# Patient Record
Sex: Male | Born: 1975 | Hispanic: Yes | Marital: Single | Smoking: Current every day smoker
Health system: Southern US, Community
[De-identification: ages and names within clinical notes are randomized; demographics above are authoritative.]

## PROBLEM LIST (undated history)

## (undated) DIAGNOSIS — B1920 Unspecified viral hepatitis C without hepatic coma: Secondary | ICD-10-CM

## (undated) HISTORY — PX: APPENDECTOMY: SHX54

---

## 2019-03-30 ENCOUNTER — Encounter (HOSPITAL_COMMUNITY): Payer: Self-pay | Admitting: Emergency Medicine

## 2019-03-30 ENCOUNTER — Other Ambulatory Visit: Payer: Self-pay

## 2019-03-30 ENCOUNTER — Emergency Department (HOSPITAL_COMMUNITY)
Admission: EM | Admit: 2019-03-30 | Discharge: 2019-03-31 | Disposition: A | Discharge status: Home / Self Care | Payer: Medicare Other | Attending: Emergency Medicine | Admitting: Emergency Medicine

## 2019-03-30 DIAGNOSIS — R1084 Generalized abdominal pain: Secondary | ICD-10-CM | POA: Insufficient documentation

## 2019-03-30 DIAGNOSIS — F17200 Nicotine dependence, unspecified, uncomplicated: Secondary | ICD-10-CM | POA: Diagnosis not present

## 2019-03-30 DIAGNOSIS — R109 Unspecified abdominal pain: Secondary | ICD-10-CM | POA: Diagnosis present

## 2019-03-30 HISTORY — DX: Unspecified viral hepatitis C without hepatic coma: B19.20

## 2019-03-30 LAB — COMPREHENSIVE METABOLIC PANEL
ALT: 39 U/L (ref 0–44)
AST: 21 U/L (ref 15–41)
Albumin: 3.8 g/dL (ref 3.5–5.0)
Alkaline Phosphatase: 81 U/L (ref 38–126)
Anion gap: 6 (ref 5–15)
BUN: 19 mg/dL (ref 6–20)
CO2: 26 mmol/L (ref 22–32)
Calcium: 8.8 mg/dL — ABNORMAL LOW (ref 8.9–10.3)
Chloride: 105 mmol/L (ref 98–111)
Creatinine, Ser: 1.07 mg/dL (ref 0.61–1.24)
GFR calc Af Amer: >60 mL/min (ref >60)
GFR calc non Af Amer: >60 mL/min (ref >60)
Glucose, Bld: 114 mg/dL — ABNORMAL HIGH (ref 70–99)
Potassium: 3.9 mmol/L (ref 3.5–5.1)
Sodium: 137 mmol/L (ref 135–145)
Total Bilirubin: 0.6 mg/dL (ref 0.3–1.2)
Total Protein: 6.7 g/dL (ref 6.5–8.1)

## 2019-03-30 LAB — URINALYSIS, ROUTINE W REFLEX MICROSCOPIC
Bilirubin Urine: NEGATIVE
Glucose, UA: NEGATIVE mg/dL
Hgb urine dipstick: NEGATIVE
Ketones, ur: NEGATIVE mg/dL
Leukocytes,Ua: NEGATIVE
Nitrite: NEGATIVE
Protein, ur: NEGATIVE mg/dL
Specific Gravity, Urine: 1.008 (ref 1.005–1.030)
pH: 6 (ref 5.0–8.0)

## 2019-03-30 LAB — CBC
HCT: 39.6 % (ref 39.0–52.0)
Hemoglobin: 13 g/dL (ref 13.0–17.0)
MCH: 28.4 pg (ref 26.0–34.0)
MCHC: 32.8 g/dL (ref 30.0–36.0)
MCV: 86.5 fL (ref 80.0–100.0)
Platelets: 211 10*3/uL (ref 150–400)
RBC: 4.58 MIL/uL (ref 4.22–5.81)
RDW: 14.4 % (ref 11.5–15.5)
WBC: 9.4 10*3/uL (ref 4.0–10.5)
nRBC: 0 % (ref 0.0–0.2)

## 2019-03-30 LAB — LIPASE, BLOOD: Lipase: 67 U/L — ABNORMAL HIGH (ref 11–51)

## 2019-03-30 MED ORDER — SODIUM CHLORIDE 0.9% FLUSH
3.0000 mL | Freq: Once | INTRAVENOUS | Status: AC
  Administered 2019-03-31: 3 mL via INTRAVENOUS

## 2019-03-30 NOTE — ED Triage Notes (Signed)
Pt reports abdominal pain with nausea X 3 days. Pt with Hx of hep C.

## 2019-03-30 NOTE — ED Provider Notes (Signed)
[facility] EMERGENCY DEPARTMENT Provider Note   CSN: [id number] Arrival date & time: 03/30/19  2222     History Chief Complaint  Patient presents with  . Abdominal Pain    Micheal Downs is a 44 y.o. male.  Patient presents to the emergency department for evaluation of abdominal pain.  Patient reports pain is predominantly in the left upper abdomen but shoots across to the right lower abdomen.  Pain waxes and wanes.  At times when it is at its worst the entire abdomen hurts.  He describes the pain as a sharp cramp.  Symptoms began 3 days ago.  He has not had nausea or vomiting but has felt very "gassy".        Past Medical History:  Diagnosis Date  . Hepatitis C     There are no problems to display for this patient.   Past Surgical History:  Procedure Laterality Date  . APPENDECTOMY         No family history on file.  Social History   Tobacco Use  . Smoking status: Current Every Day Smoker  . Smokeless tobacco: Never Used  Substance Use Topics  . Alcohol use: Not Currently  . Drug use: Yes    Types: Marijuana    Home Medications Prior to Admission medications   Medication Sig Start Date End Date Taking? Authorizing Provider  dicyclomine (BENTYL) 20 MG tablet Take 1 tablet (20 mg total) by mouth 3 (three) times daily before meals. 03/31/19   Orpah Greek, MD  pantoprazole (PROTONIX) 40 MG tablet Take 1 tablet (40 mg total) by mouth daily. 03/31/19   Sumire Halbleib, Gwenyth Allegra, MD  polyethylene glycol (MIRALAX / GLYCOLAX) 17 g packet Take 17 g by mouth daily. 03/31/19   Orpah Greek, MD    Allergies    Geodon [ziprasidone hcl], Haldol [haloperidol], and Risperidone and related  Review of Systems   Review of Systems  Gastrointestinal: Positive for abdominal pain.  All other systems reviewed and are negative.   Physical Exam Updated Vital Signs BP (!) 141/86 (BP Location: Right Arm)   Pulse 75   Temp 98.1 F (36.7 C) (Oral)   Resp 20    Ht 6' (1.829 m)   Wt 86.2 kg   SpO2 98%   BMI 25.77 kg/m   Physical Exam Vitals and nursing note reviewed.  Constitutional:      General: He is not in acute distress.    Appearance: Normal appearance. He is well-developed.  HENT:     Head: Normocephalic and atraumatic.     Right Ear: Hearing normal.     Left Ear: Hearing normal.     Nose: Nose normal.  Eyes:     Conjunctiva/sclera: Conjunctivae normal.     Pupils: Pupils are equal, round, and reactive to light.  Cardiovascular:     Rate and Rhythm: Regular rhythm.     Heart sounds: S1 normal and S2 normal. No murmur. No friction rub. No gallop.   Pulmonary:     Effort: Pulmonary effort is normal. No respiratory distress.     Breath sounds: Normal breath sounds.  Chest:     Chest wall: No tenderness.  Abdominal:     General: Bowel sounds are normal.     Palpations: Abdomen is soft.     Tenderness: There is abdominal tenderness in the epigastric area and left upper quadrant. There is no guarding or rebound. Negative signs include Murphy's sign and McBurney's sign.  Hernia: No hernia is present.  Musculoskeletal:        General: Normal range of motion.     Cervical back: Normal range of motion and neck supple.  Skin:    General: Skin is warm and dry.     Findings: No rash.  Neurological:     Mental Status: He is alert and oriented to person, place, and time.     GCS: GCS eye subscore is 4. GCS verbal subscore is 5. GCS motor subscore is 6.     Cranial Nerves: No cranial nerve deficit.     Sensory: No sensory deficit.     Coordination: Coordination normal.  Psychiatric:        Speech: Speech normal.        Behavior: Behavior normal.        Thought Content: Thought content normal.     ED Results / Procedures / Treatments   Labs (all labs ordered are listed, but only abnormal results are displayed) Labs Reviewed  LIPASE, BLOOD - Abnormal; Notable for the following components:      Result Value   Lipase 67 (*)      All other components within normal limits  COMPREHENSIVE METABOLIC PANEL - Abnormal; Notable for the following components:   Glucose, Bld 114 (*)    Calcium 8.8 (*)    All other components within normal limits  URINALYSIS, ROUTINE W REFLEX MICROSCOPIC - Abnormal; Notable for the following components:   Color, Urine STRAW (*)    All other components within normal limits  CBC    EKG None  Radiology CT ABDOMEN PELVIS W CONTRAST  Result Date: 03/31/2019 CLINICAL DATA:  Left upper quadrant abdominal pain EXAM: CT ABDOMEN AND PELVIS WITH CONTRAST TECHNIQUE: Multidetector CT imaging of the abdomen and pelvis was performed using the standard protocol following bolus administration of intravenous contrast. CONTRAST:  OMNIPAQUE IOHEXOL 300 MG/ML  SOLN COMPARISON:  None. FINDINGS: Lower chest: The visualized heart size within normal limits. No pericardial fluid/thickening. No hiatal hernia. The visualized portions of the lungs are clear. Hepatobiliary: There is a tiny 7 mm hypodense lesion seen in the anterior left liver lobe. The main portal vein is patent. No evidence of calcified gallstones, gallbladder wall thickening or biliary dilatation. Pancreas: Unremarkable. No pancreatic ductal dilatation or surrounding inflammatory changes. Spleen: Normal in size without focal abnormality. Adrenals/Urinary Tract: Both adrenal glands appear normal. The kidneys and collecting system appear normal without evidence of urinary tract calculus or hydronephrosis. Bladder is unremarkable. Stomach/Bowel: There is a moderately dilated stomach with fluid and food contents seen. No gastric wall thickening or surrounding inflammatory changes. However is noted. The small bowel is unremarkable. There does however appear to be some fecalization of distal ileal loops. A moderate amount of colonic stool is present. Scattered colonic diverticula are noted. No inflammatory changes, wall thickening, or obstructive findings.  Vascular/Lymphatic: There are no enlarged mesenteric, retroperitoneal, or pelvic lymph nodes. Scattered aortic atherosclerosis is noted. Reproductive: The prostate is unremarkable. Other: No evidence of abdominal wall mass or hernia. Musculoskeletal: No acute or significant osseous findings. IMPRESSION: 1. Moderately dilated stomach with fluid and food debris which could be due to gastric outlet obstruction or delayed gastric emptying. 2. Fecalization of the distal ileal loops, which can be due to slow transit. 3. Diverticulosis without diverticulitis. 4.  Aortic Atherosclerosis (ICD10-I70.0). Electronically Signed   By: Jonna Clark M.D.   On: 03/31/2019 00:33    Procedures Procedures (including critical care time)  Medications Ordered in ED Medications  sodium chloride flush (NS) 0.9 % injection 3 mL (3 mLs Intravenous Given 03/31/19 0013)  sodium chloride 0.9 % bolus 1,000 mL (1,000 mLs Intravenous New Bag/Given 03/31/19 0013)  morphine 4 MG/ML injection 4 mg (4 mg Intravenous Given 03/31/19 0010)  ondansetron (ZOFRAN) injection 4 mg (4 mg Intravenous Given 03/31/19 0010)  iohexol (OMNIPAQUE) 300 MG/ML solution 100 mL (100 mLs Intravenous Contrast Given 03/31/19 0015)    ED Course  I have reviewed the triage vital signs and the nursing notes.  Pertinent labs & imaging results that were available during my care of the patient were reviewed by me and considered in my medical decision making (see chart for details).    MDM Rules/Calculators/A&P                      Patient presents to the emergency department for evaluation of abdominal pain.  Symptoms ongoing for days.  Pain is predominantly in the left upper quadrant but radiates throughout the abdomen.  Patient reports that he has a history of hepatitis C, currently untreated.  Additionally he has a history of chronic constipation.  He thought his pain was secondary to his constipation, but he had a bowel movement this morning after an enema and  pain did not change.  Abdominal exam reveals tenderness diffusely but no guarding or rebound.  No acute surgical process suspected.  Lab work is normal, other than very slightly elevated lipase at 67.  Patient denies alcohol use.  He does not have right upper quadrant tenderness or Murphy sign.  CT scan unremarkable.  Will treat symptomatically.  Patient will require follow-up with GI secondary to his chronic hepatitis C and ongoing constipation issues.  Final Clinical Impression(s) / ED Diagnoses Final diagnoses:  None    Rx / DC Orders ED Discharge Orders         Ordered    polyethylene glycol (MIRALAX / GLYCOLAX) 17 g packet  Daily,   Status:  Discontinued     03/31/19 0053    pantoprazole (PROTONIX) 40 MG tablet  Daily,   Status:  Discontinued     03/31/19 0053    pantoprazole (PROTONIX) 40 MG tablet  Daily     03/31/19 0056    polyethylene glycol (MIRALAX / GLYCOLAX) 17 g packet  Daily     03/31/19 0056    dicyclomine (BENTYL) 20 MG tablet  3 times daily before meals     03/31/19 0100           Gilda Crease, MD 03/31/19 0100

## 2019-03-31 ENCOUNTER — Emergency Department (HOSPITAL_COMMUNITY): Payer: Medicare Other

## 2019-03-31 MED ORDER — PANTOPRAZOLE SODIUM 40 MG PO TBEC: 40.0000 mg | DELAYED_RELEASE_TABLET | Freq: Every day | ORAL | 3 refills | Status: DC

## 2019-03-31 MED ORDER — SODIUM CHLORIDE 0.9 % IV BOLUS
1000.0000 mL | Freq: Once | INTRAVENOUS | Status: AC
  Administered 2019-03-31: 1000 mL via INTRAVENOUS

## 2019-03-31 MED ORDER — POLYETHYLENE GLYCOL 3350 17 G PO PACK: 17.0000 g | PACK | Freq: Every day | ORAL | 0 refills | Status: DC

## 2019-03-31 MED ORDER — IOHEXOL 300 MG/ML  SOLN
100.0000 mL | Freq: Once | INTRAMUSCULAR | Status: AC | PRN
  Administered 2019-03-31: 100 mL via INTRAVENOUS

## 2019-03-31 MED ORDER — MORPHINE SULFATE (PF) 4 MG/ML IV SOLN
4.0000 mg | Freq: Once | INTRAVENOUS | Status: AC
  Administered 2019-03-31: 4 mg via INTRAVENOUS
  Filled 2019-03-31: qty 1

## 2019-03-31 MED ORDER — PANTOPRAZOLE SODIUM 40 MG PO TBEC: 40.0000 mg | DELAYED_RELEASE_TABLET | Freq: Every day | ORAL | 3 refills | Status: AC

## 2019-03-31 MED ORDER — ONDANSETRON HCL 4 MG/2ML IJ SOLN
4.0000 mg | Freq: Once | INTRAMUSCULAR | Status: AC
  Administered 2019-03-31: 4 mg via INTRAVENOUS
  Filled 2019-03-31: qty 2

## 2019-03-31 MED ORDER — POLYETHYLENE GLYCOL 3350 17 G PO PACK: 17.0000 g | PACK | Freq: Every day | ORAL | 0 refills | Status: AC

## 2019-03-31 MED ORDER — DICYCLOMINE HCL 20 MG PO TABS: 20.0000 mg | ORAL_TABLET | Freq: Three times a day (TID) | ORAL | 3 refills | Status: AC

## 2019-04-14 ENCOUNTER — Telehealth: Payer: Medicare Other | Admitting: Family

## 2019-04-14 ENCOUNTER — Inpatient Hospital Stay
Admission: RE | Admit: 2019-04-14 | Discharge: 2019-04-14 | Disposition: A | Discharge status: Home / Self Care | Payer: Medicare Other | Source: Ambulatory Visit

## 2019-04-14 ENCOUNTER — Encounter (HOSPITAL_COMMUNITY): Payer: Self-pay | Admitting: Emergency Medicine

## 2019-04-14 ENCOUNTER — Other Ambulatory Visit: Payer: Self-pay

## 2019-04-14 ENCOUNTER — Emergency Department (HOSPITAL_COMMUNITY)
Admission: EM | Admit: 2019-04-14 | Discharge: 2019-04-14 | Disposition: A | Discharge status: Home / Self Care | Payer: Medicare Other | Attending: Emergency Medicine | Admitting: Emergency Medicine

## 2019-04-14 DIAGNOSIS — F1210 Cannabis abuse, uncomplicated: Secondary | ICD-10-CM | POA: Insufficient documentation

## 2019-04-14 DIAGNOSIS — R1032 Left lower quadrant pain: Secondary | ICD-10-CM | POA: Insufficient documentation

## 2019-04-14 DIAGNOSIS — Z79899 Other long term (current) drug therapy: Secondary | ICD-10-CM | POA: Insufficient documentation

## 2019-04-14 DIAGNOSIS — F17200 Nicotine dependence, unspecified, uncomplicated: Secondary | ICD-10-CM | POA: Diagnosis not present

## 2019-04-14 DIAGNOSIS — R519 Headache, unspecified: Secondary | ICD-10-CM

## 2019-04-14 DIAGNOSIS — T63301A Toxic effect of unspecified spider venom, accidental (unintentional), initial encounter: Secondary | ICD-10-CM

## 2019-04-14 DIAGNOSIS — R42 Dizziness and giddiness: Secondary | ICD-10-CM

## 2019-04-14 LAB — COMPREHENSIVE METABOLIC PANEL
ALT: 55 U/L — ABNORMAL HIGH (ref 0–44)
AST: 27 U/L (ref 15–41)
Albumin: 3.4 g/dL — ABNORMAL LOW (ref 3.5–5.0)
Alkaline Phosphatase: 76 U/L (ref 38–126)
Anion gap: 6 (ref 5–15)
BUN: 17 mg/dL (ref 6–20)
CO2: 26 mmol/L (ref 22–32)
Calcium: 8.4 mg/dL — ABNORMAL LOW (ref 8.9–10.3)
Chloride: 109 mmol/L (ref 98–111)
Creatinine, Ser: 0.98 mg/dL (ref 0.61–1.24)
GFR calc Af Amer: >60 mL/min (ref >60)
GFR calc non Af Amer: >60 mL/min (ref >60)
Glucose, Bld: 112 mg/dL — ABNORMAL HIGH (ref 70–99)
Potassium: 4.1 mmol/L (ref 3.5–5.1)
Sodium: 141 mmol/L (ref 135–145)
Total Bilirubin: 0.5 mg/dL (ref 0.3–1.2)
Total Protein: 6.4 g/dL — ABNORMAL LOW (ref 6.5–8.1)

## 2019-04-14 LAB — CBC WITH DIFFERENTIAL/PLATELET
Abs Immature Granulocytes: 0.02 10*3/uL (ref 0.00–0.07)
Basophils Absolute: 0 10*3/uL (ref 0.0–0.1)
Basophils Relative: 1 %
Eosinophils Absolute: 0.2 10*3/uL (ref 0.0–0.5)
Eosinophils Relative: 3 %
HCT: 40.1 % (ref 39.0–52.0)
Hemoglobin: 12.9 g/dL — ABNORMAL LOW (ref 13.0–17.0)
Immature Granulocytes: 0 %
Lymphocytes Relative: 43 %
Lymphs Abs: 2.8 10*3/uL (ref 0.7–4.0)
MCH: 28.2 pg (ref 26.0–34.0)
MCHC: 32.2 g/dL (ref 30.0–36.0)
MCV: 87.6 fL (ref 80.0–100.0)
Monocytes Absolute: 0.5 10*3/uL (ref 0.1–1.0)
Monocytes Relative: 7 %
Neutro Abs: 3.1 10*3/uL (ref 1.7–7.7)
Neutrophils Relative %: 46 %
Platelets: 203 10*3/uL (ref 150–400)
RBC: 4.58 MIL/uL (ref 4.22–5.81)
RDW: 14.5 % (ref 11.5–15.5)
WBC: 6.6 10*3/uL (ref 4.0–10.5)
nRBC: 0 % (ref 0.0–0.2)

## 2019-04-14 LAB — URINALYSIS, ROUTINE W REFLEX MICROSCOPIC
Bilirubin Urine: NEGATIVE
Glucose, UA: NEGATIVE mg/dL
Hgb urine dipstick: NEGATIVE
Ketones, ur: NEGATIVE mg/dL
Leukocytes,Ua: NEGATIVE
Nitrite: NEGATIVE
Protein, ur: NEGATIVE mg/dL
Specific Gravity, Urine: 1.025 (ref 1.005–1.030)
pH: 6 (ref 5.0–8.0)

## 2019-04-14 LAB — LIPASE, BLOOD: Lipase: 32 U/L (ref 11–51)

## 2019-04-14 MED ORDER — ONDANSETRON 8 MG PO TBDP
8.0000 mg | ORAL_TABLET | Freq: Once | ORAL | Status: AC
  Administered 2019-04-14: 8 mg via ORAL
  Filled 2019-04-14: qty 1

## 2019-04-14 MED ORDER — OXYCODONE-ACETAMINOPHEN 5-325 MG PO TABS
1.0000 | ORAL_TABLET | Freq: Once | ORAL | Status: AC
  Administered 2019-04-14: 1 via ORAL
  Filled 2019-04-14: qty 1

## 2019-04-14 NOTE — ED Triage Notes (Signed)
Pt here for abdominal pain and "burning in kidneys". Pt recently treated for same. States no better.

## 2019-04-14 NOTE — Progress Notes (Signed)
Based on what you shared with me, I feel your condition warrants further evaluation and I recommend that you be seen for a face to face office visit.  Given your symptoms and your recent spider you need to be seen face-to-face to rule out a more serious infection.   NOTE: If you entered your credit card information for this eVisit, you will not be charged. You may see a "hold" on your card for the $35 but that hold will drop off and you will not have a charge processed.   If you are having a true medical emergency please call 911.      For an urgent face to face visit, [facility] has five urgent care centers for your convenience:      NEW:  [facility] Urgent [facility] Center at [facility] Directions [phone] [address] [place], [place] [address] . 10 am - 6pm Monday - Friday    [facility] Health Urgent Care Center [facility]) Get Driving Directions [phone] [address] [place], [place] [address] . 10 am to 8 pm Monday-Friday . 12 pm to 8 pm [facility] Health Urgent Care at [facility] Get Driving Directions [phone] [address], [address] [place], [place] [address] . 8 am to 8 pm Monday-Friday . 9 am to 6 pm Saturday . 11 am to 6 pm Sunday     [facility] Health Urgent Care at [facility] Get Driving Directions  [phone] [address] [place], [place] [address] . 8 am to 8 pm Monday-Friday . 8 am to 4 pm [facility] Urgent Care at [facility] Directions [phone] [address] [place], [place] [address] . 12 pm to 6 pm Monday-Friday      Your e-visit answers were reviewed by a board certified advanced clinical practitioner to complete your personal care plan.  Thank you for using e-Visits.

## 2019-04-14 NOTE — ED Provider Notes (Signed)
[facility] EMERGENCY DEPARTMENT Provider Note   CSN: [id number] Arrival date & time: 04/14/19  0046     History Chief Complaint  Patient presents with  . Abdominal Pain    Micheal Downs is a 44 y.o. male.  Patient presents to the emergency department for evaluation of abdominal pain.  Patient reports pain in the left lower abdomen and around into his back.  He reports a "burning" in his kidney.        Past Medical History:  Diagnosis Date  . Hepatitis C     There are no problems to display for this patient.   Past Surgical History:  Procedure Laterality Date  . APPENDECTOMY         History reviewed. No pertinent family history.  Social History   Tobacco Use  . Smoking status: Current Every Day Smoker  . Smokeless tobacco: Never Used  Substance Use Topics  . Alcohol use: Not Currently  . Drug use: Yes    Types: Marijuana    Home Medications Prior to Admission medications   Medication Sig Start Date End Date Taking? Authorizing Provider  dicyclomine (BENTYL) 20 MG tablet Take 1 tablet (20 mg total) by mouth 3 (three) times daily before meals. 03/31/19   Orpah Greek, MD  pantoprazole (PROTONIX) 40 MG tablet Take 1 tablet (40 mg total) by mouth daily. 03/31/19   Evalynn Hankins, Gwenyth Allegra, MD  polyethylene glycol (MIRALAX / GLYCOLAX) 17 g packet Take 17 g by mouth daily. 03/31/19   Orpah Greek, MD    Allergies    Geodon [ziprasidone hcl], Haldol [haloperidol], and Risperidone and related  Review of Systems   Review of Systems  Gastrointestinal: Positive for abdominal pain.  All other systems reviewed and are negative.   Physical Exam Updated Vital Signs BP (!) 142/91 (BP Location: Right Arm)   Pulse 65   Temp 98.1 F (36.7 C) (Oral)   Ht 6' (1.829 m)   Wt 86.1 kg   SpO2 98%   BMI 25.74 kg/m   Physical Exam Vitals and nursing note reviewed.  Constitutional:      General: He is not in acute distress.    Appearance: Normal  appearance. He is well-developed.  HENT:     Head: Normocephalic and atraumatic.     Right Ear: Hearing normal.     Left Ear: Hearing normal.     Nose: Nose normal.  Eyes:     Conjunctiva/sclera: Conjunctivae normal.     Pupils: Pupils are equal, round, and reactive to light.  Cardiovascular:     Rate and Rhythm: Regular rhythm.     Heart sounds: S1 normal and S2 normal. No murmur. No friction rub. No gallop.   Pulmonary:     Effort: Pulmonary effort is normal. No respiratory distress.     Breath sounds: Normal breath sounds.  Chest:     Chest wall: No tenderness.  Abdominal:     General: Bowel sounds are normal.     Palpations: Abdomen is soft.     Tenderness: There is abdominal tenderness in the left lower quadrant. There is no guarding or rebound. Negative signs include Murphy's sign and McBurney's sign.     Hernia: No hernia is present.  Musculoskeletal:        General: Normal range of motion.     Cervical back: Normal range of motion and neck supple.  Skin:    General: Skin is warm and dry.     Findings:  No rash.  Neurological:     Mental Status: He is alert and oriented to person, place, and time.     GCS: GCS eye subscore is 4. GCS verbal subscore is 5. GCS motor subscore is 6.     Cranial Nerves: No cranial nerve deficit.     Sensory: No sensory deficit.     Coordination: Coordination normal.  Psychiatric:        Speech: Speech normal.        Behavior: Behavior normal.        Thought Content: Thought content normal.     ED Results / Procedures / Treatments   Labs (all labs ordered are listed, but only abnormal results are displayed) Labs Reviewed  CBC WITH DIFFERENTIAL/PLATELET - Abnormal; Notable for the following components:      Result Value   Hemoglobin 12.9 (*)    All other components within normal limits  COMPREHENSIVE METABOLIC PANEL - Abnormal; Notable for the following components:   Glucose, Bld 112 (*)    Calcium 8.4 (*)    Total Protein 6.4 (*)      Albumin 3.4 (*)    ALT 55 (*)    All other components within normal limits  LIPASE, BLOOD  URINALYSIS, ROUTINE W REFLEX MICROSCOPIC    EKG None  Radiology No results found.  Procedures Procedures (including critical care time)  Medications Ordered in ED Medications - No data to display  ED Course  I have reviewed the triage vital signs and the nursing notes.  Pertinent labs & imaging results that were available during my care of the patient were reviewed by me and considered in my medical decision making (see chart for details).    MDM Rules/Calculators/A&P                      Patient presents to the emergency department with continued pain.  I saw the patient with similar complaints approximately 3 weeks ago.  He has not followed up with GI or anyone since that visit.  Pain has been persistent since he was seen.  During his last visit his labs were normal other than very slightly elevated lipase.  His CT scan at that time, however, was unremarkable.  Lab work is entirely normal tonight.  Exam reveals mild left lower tenderness but no guarding or rebound.  This is similar to previous exam.  Does not require repeat imaging.  Final Clinical Impression(s) / ED Diagnoses Final diagnoses:  Left lower quadrant abdominal pain    Rx / DC Orders ED Discharge Orders    None       Shalimar Mcclain, Canary Brim, MD 04/14/19 [id number]

## 2019-04-23 ENCOUNTER — Other Ambulatory Visit: Payer: Self-pay

## 2019-04-23 ENCOUNTER — Encounter (HOSPITAL_COMMUNITY): Payer: Self-pay | Admitting: *Deleted

## 2019-04-23 ENCOUNTER — Emergency Department (HOSPITAL_COMMUNITY)
Admission: EM | Admit: 2019-04-23 | Discharge: 2019-04-23 | Disposition: A | Discharge status: Home / Self Care | Payer: Medicare Other | Attending: Emergency Medicine | Admitting: Emergency Medicine

## 2019-04-23 DIAGNOSIS — R109 Unspecified abdominal pain: Secondary | ICD-10-CM | POA: Insufficient documentation

## 2019-04-23 DIAGNOSIS — F17200 Nicotine dependence, unspecified, uncomplicated: Secondary | ICD-10-CM | POA: Insufficient documentation

## 2019-04-23 DIAGNOSIS — R030 Elevated blood-pressure reading, without diagnosis of hypertension: Secondary | ICD-10-CM | POA: Insufficient documentation

## 2019-04-23 DIAGNOSIS — F1410 Cocaine abuse, uncomplicated: Secondary | ICD-10-CM | POA: Diagnosis not present

## 2019-04-23 DIAGNOSIS — Z79899 Other long term (current) drug therapy: Secondary | ICD-10-CM | POA: Insufficient documentation

## 2019-04-23 DIAGNOSIS — R7401 Elevation of levels of liver transaminase levels: Secondary | ICD-10-CM | POA: Diagnosis not present

## 2019-04-23 DIAGNOSIS — R42 Dizziness and giddiness: Secondary | ICD-10-CM | POA: Diagnosis present

## 2019-04-23 LAB — COMPREHENSIVE METABOLIC PANEL
ALT: 64 U/L — ABNORMAL HIGH (ref 0–44)
AST: 39 U/L (ref 15–41)
Albumin: 3.9 g/dL (ref 3.5–5.0)
Alkaline Phosphatase: 66 U/L (ref 38–126)
Anion gap: 8 (ref 5–15)
BUN: 15 mg/dL (ref 6–20)
CO2: 26 mmol/L (ref 22–32)
Calcium: 8.7 mg/dL — ABNORMAL LOW (ref 8.9–10.3)
Chloride: 101 mmol/L (ref 98–111)
Creatinine, Ser: 1.19 mg/dL (ref 0.61–1.24)
GFR calc Af Amer: >60 mL/min (ref >60)
GFR calc non Af Amer: >60 mL/min (ref >60)
Glucose, Bld: 105 mg/dL — ABNORMAL HIGH (ref 70–99)
Potassium: 3.9 mmol/L (ref 3.5–5.1)
Sodium: 135 mmol/L (ref 135–145)
Total Bilirubin: 0.8 mg/dL (ref 0.3–1.2)
Total Protein: 7.1 g/dL (ref 6.5–8.1)

## 2019-04-23 LAB — CBC WITH DIFFERENTIAL/PLATELET
Abs Immature Granulocytes: 0.04 10*3/uL (ref 0.00–0.07)
Basophils Absolute: 0 10*3/uL (ref 0.0–0.1)
Basophils Relative: 0 %
Eosinophils Absolute: 0.1 10*3/uL (ref 0.0–0.5)
Eosinophils Relative: 1 %
HCT: 40.8 % (ref 39.0–52.0)
Hemoglobin: 13.3 g/dL (ref 13.0–17.0)
Immature Granulocytes: 0 %
Lymphocytes Relative: 23 %
Lymphs Abs: 2.5 10*3/uL (ref 0.7–4.0)
MCH: 27.9 pg (ref 26.0–34.0)
MCHC: 32.6 g/dL (ref 30.0–36.0)
MCV: 85.5 fL (ref 80.0–100.0)
Monocytes Absolute: 0.8 10*3/uL (ref 0.1–1.0)
Monocytes Relative: 8 %
Neutro Abs: 7.3 10*3/uL (ref 1.7–7.7)
Neutrophils Relative %: 68 %
Platelets: 213 10*3/uL (ref 150–400)
RBC: 4.77 MIL/uL (ref 4.22–5.81)
RDW: 14.5 % (ref 11.5–15.5)
WBC: 10.8 10*3/uL — ABNORMAL HIGH (ref 4.0–10.5)
nRBC: 0 % (ref 0.0–0.2)

## 2019-04-23 LAB — RAPID URINE DRUG SCREEN, HOSP PERFORMED
Amphetamines: NOT DETECTED
Barbiturates: NOT DETECTED
Benzodiazepines: NOT DETECTED
Cocaine: POSITIVE — AB
Opiates: NOT DETECTED
Tetrahydrocannabinol: POSITIVE — AB

## 2019-04-23 LAB — ETHANOL: Alcohol, Ethyl (B): <10 mg/dL (ref <10)

## 2019-04-23 LAB — MAGNESIUM: Magnesium: 2.1 mg/dL (ref 1.7–2.4)

## 2019-04-23 MED ORDER — KETOROLAC TROMETHAMINE 30 MG/ML IJ SOLN
30.0000 mg | Freq: Once | INTRAMUSCULAR | Status: AC
  Administered 2019-04-23: 30 mg via INTRAVENOUS
  Filled 2019-04-23: qty 1

## 2019-04-23 MED ORDER — ONDANSETRON HCL 4 MG/2ML IJ SOLN
4.0000 mg | Freq: Once | INTRAMUSCULAR | Status: AC
  Administered 2019-04-23: 4 mg via INTRAVENOUS
  Filled 2019-04-23: qty 2

## 2019-04-23 MED ORDER — SODIUM CHLORIDE 0.9 % IV BOLUS
1000.0000 mL | Freq: Once | INTRAVENOUS | Status: AC
  Administered 2019-04-23: 1000 mL via INTRAVENOUS

## 2019-04-23 NOTE — ED Provider Notes (Signed)
[facility] EMERGENCY DEPARTMENT Provider Note   CSN: [id number] Arrival date & time: 04/23/19  0133   History Chief Complaint  Patient presents with  . Drug Overdose    Micheal Downs is a 44 y.o. male.  The history is provided by the patient.  Drug Overdose  He has history of hepatitis C and comes in stating that he thinks he used cocaine that was laced with arsenic.  He used cocaine about 1 hour ago.  He also admits to taking 2 Percocet tablets and smoking some marijuana.  He is concerned that all of his drugs have come from the same source.  He is complaining of some mid abdominal pain and states that he feels lightheaded.  He states that he had been drinking yesterday but denies ethanol consumption today.  Past Medical History:  Diagnosis Date  . Hepatitis C     There are no problems to display for this patient.   Past Surgical History:  Procedure Laterality Date  . APPENDECTOMY         History reviewed. No pertinent family history.  Social History   Tobacco Use  . Smoking status: Current Every Day Smoker  . Smokeless tobacco: Never Used  Substance Use Topics  . Alcohol use: Not Currently  . Drug use: Yes    Types: Marijuana, Cocaine    Home Medications Prior to Admission medications   Medication Sig Start Date End Date Taking? Authorizing Provider  dicyclomine (BENTYL) 20 MG tablet Take 1 tablet (20 mg total) by mouth 3 (three) times daily before meals. 03/31/19   Orpah Greek, MD  pantoprazole (PROTONIX) 40 MG tablet Take 1 tablet (40 mg total) by mouth daily. 03/31/19   Pollina, Gwenyth Allegra, MD  polyethylene glycol (MIRALAX / GLYCOLAX) 17 g packet Take 17 g by mouth daily. 03/31/19   Orpah Greek, MD    Allergies    Geodon [ziprasidone hcl], Haldol [haloperidol], and Risperidone and related  Review of Systems   Review of Systems  All other systems reviewed and are negative.   Physical Exam Updated Vital Signs BP (!) 143/107    Pulse 76   Temp 98.8 F (37.1 C)   Resp 15   Ht 6' (1.829 m)   Wt 88.5 kg   SpO2 99%   BMI 26.45 kg/m   Physical Exam Vitals and nursing note reviewed.   44 year old male, resting comfortably and in no acute distress. Vital signs are significant for elevated blood pressure. Oxygen saturation is 99%, which is normal. Head is normocephalic and atraumatic. PERRLA, EOMI. Oropharynx is clear. Neck is nontender and supple without adenopathy or JVD. Back is nontender and there is no CVA tenderness. Lungs are clear without rales, wheezes, or rhonchi. Chest is nontender. Heart has regular rate and rhythm without murmur. Abdomen is soft, flat, nontender without masses or hepatosplenomegaly and peristalsis is hypoactive. Extremities have no cyanosis or edema, full range of motion is present. Skin is warm and dry without rash. Neurologic: Mental status is normal, cranial nerves are intact, there are no motor or sensory deficits.  ED Results / Procedures / Treatments   Labs (all labs ordered are listed, but only abnormal results are displayed) Labs Reviewed  COMPREHENSIVE METABOLIC PANEL - Abnormal; Notable for the following components:      Result Value   Glucose, Bld 105 (*)    Calcium 8.7 (*)    ALT 64 (*)    All other components within normal  limits  CBC WITH DIFFERENTIAL/PLATELET - Abnormal; Notable for the following components:   WBC 10.8 (*)    All other components within normal limits  RAPID URINE DRUG SCREEN, HOSP PERFORMED - Abnormal; Notable for the following components:   Cocaine POSITIVE (*)    Tetrahydrocannabinol POSITIVE (*)    All other components within normal limits  ETHANOL  MAGNESIUM  HEAVY METALS PROFILE, URINE    EKG EKG Interpretation  Date/Time:  Wednesday April 23 2019 02:25:30 EDT Ventricular Rate:  67 PR Interval:    QRS Duration: 99 QT Interval:  443 QTC Calculation: 468 R Axis:   79 Text Interpretation: Sinus rhythm Normal ECG No old tracing  to compare Confirmed by Dione Booze ([id number]) on 04/23/2019 2:35:14 AM   Procedures Procedures   Medications Ordered in ED Medications  sodium chloride 0.9 % bolus 1,000 mL (0 mLs Intravenous Stopped 04/23/19 0257)  ondansetron (ZOFRAN) injection 4 mg (4 mg Intravenous Given 04/23/19 0222)  ketorolac (TORADOL) 30 MG/ML injection 30 mg (30 mg Intravenous Given 04/23/19 6712)    ED Course  I have reviewed the triage vital signs and the nursing notes.  Pertinent labs & imaging results that were available during my care of the patient were reviewed by me and considered in my medical decision making (see chart for details).  MDM Rules/Calculators/A&P [place] following use of cocaine and oxycodone.  No signs of arsenic toxicity on exam, but will check urine sample for heavy metals.  He will be given IV fluids, ketorolac, ondansetron.  Will check screening labs.  Old records are reviewed, and he has no relevant past visits.  Labs show mild elevation of ALT which has been present previously.  Urine heavy metal screen will not be available during the ED stay.  Patient is advised of this and told to look up results on MyChart.  He is discharged with instructions to abstain from illicit drugs.  Final Clinical Impression(s) / ED Diagnoses Final diagnoses:  Cocaine abuse (HCC)  Elevated ALT measurement    Rx / DC Orders ED Discharge Orders    None       Dione Booze, MD 04/23/19 [phone]

## 2019-04-23 NOTE — ED Notes (Signed)
Pt ambulatory to waiting room. Pt verbalized understanding of discharge instructions.   

## 2019-04-23 NOTE — ED Triage Notes (Signed)
Pt brought in by [facility] for c/o feeling "funny" after snorting cocaine x 3 hours ago and states he swallowed 2 percocet; pt states he still feels dizzy right now and has a strange taste in his mouth; pt states he thinks the cocaine might have been laced with arsenic

## 2019-04-23 NOTE — Discharge Instructions (Addendum)
DO NOT USE COCAINE, OR ANY OTHER DRUGS!!  He did not show any of the signs of arsenic poisoning.  However, the results of your urine heavy metal screen are not available yet.  You can check those results with MyChart.

## 2019-04-28 LAB — HEAVY METALS PROFILE, URINE
Arsenic (Total),U: 10 ug/L (ref 0–50)
Arsenic(Inorganic),U: NOT DETECTED ug/L (ref 0–19)
Creatinine(Crt),U: 0.18 g/L — ABNORMAL LOW (ref 0.30–3.00)
Lead, Rand Ur: NOT DETECTED ug/L (ref 0–49)
Mercury, Ur: NOT DETECTED ug/L (ref 0–19)

## 2021-10-02 IMAGING — CT CT ABD-PELV W/ CM
2 of 5 series · 15 of 46 positions shown, 17 images · IV contrast (Omnipaque or Isovue)
Comparison: None.

CLINICAL DATA: Left upper quadrant abdominal pain

EXAM:
CT ABDOMEN AND PELVIS WITH CONTRAST
TECHNIQUE: Multidetector CT imaging of the abdomen and pelvis was performed
using the standard protocol following bolus administration of
intravenous contrast.
CONTRAST:  100mL OMNIPAQUE IOHEXOL 300 MG/ML  SOLN

[Series 2: axial st · axial · 0.76mm/px · z∈[-498,-113]mm · 12 of 93 slices shown, 14 images]
[im 8/93  soft-tissue]
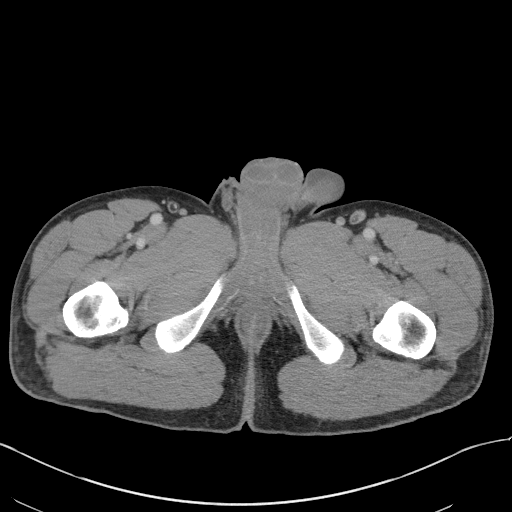
[im 8/93  bone]
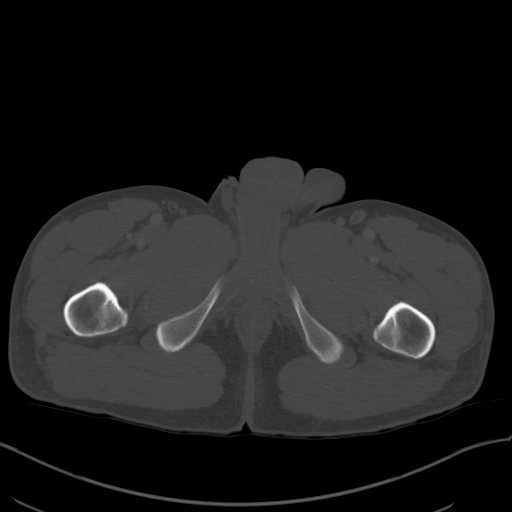
[im 15/93  soft-tissue]
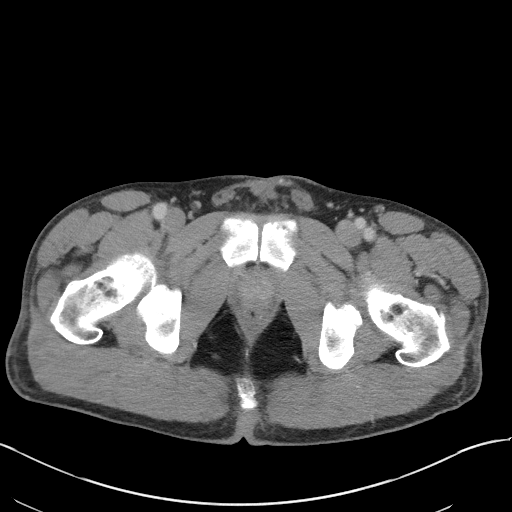
[im 22/93  soft-tissue]
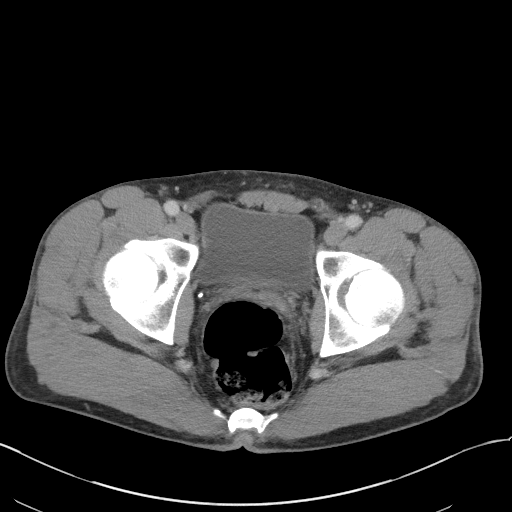
[im 29/93  soft-tissue]
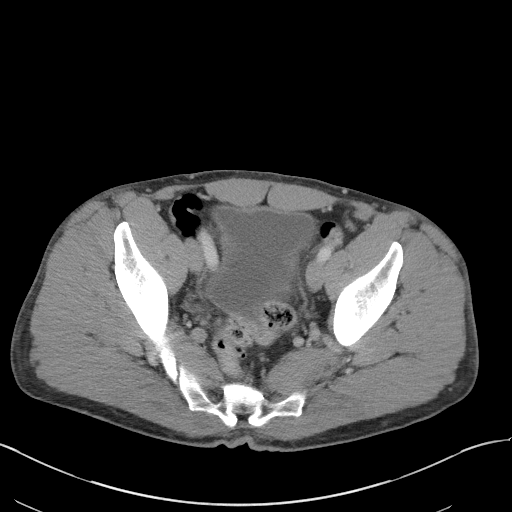
[im 36/93  soft-tissue]
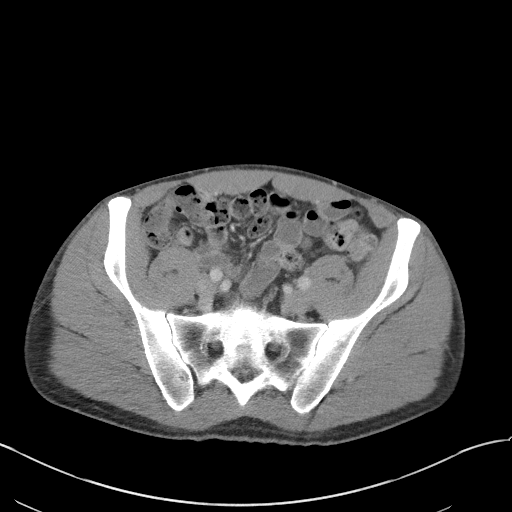
[im 43/93  soft-tissue]
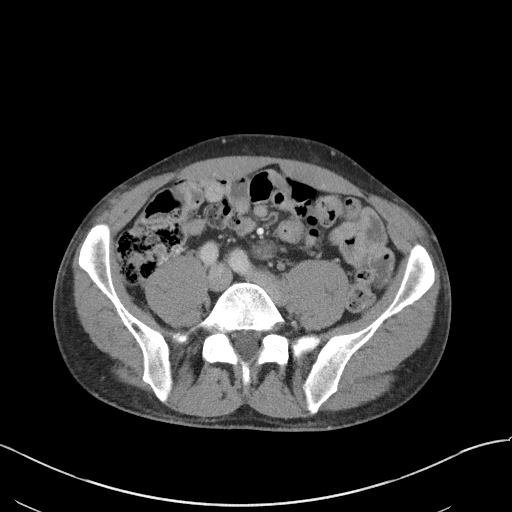
[im 50/93  soft-tissue]
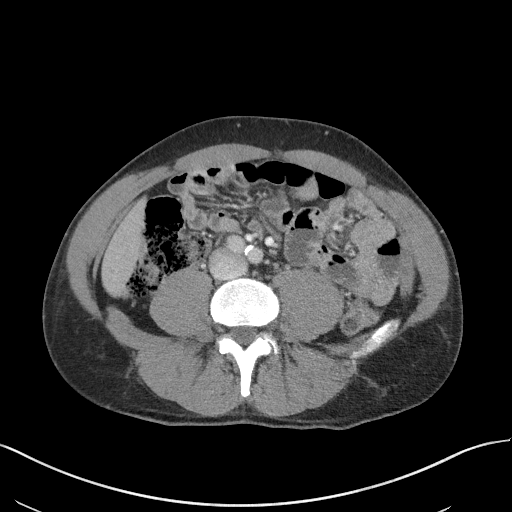
[im 57/93  soft-tissue]
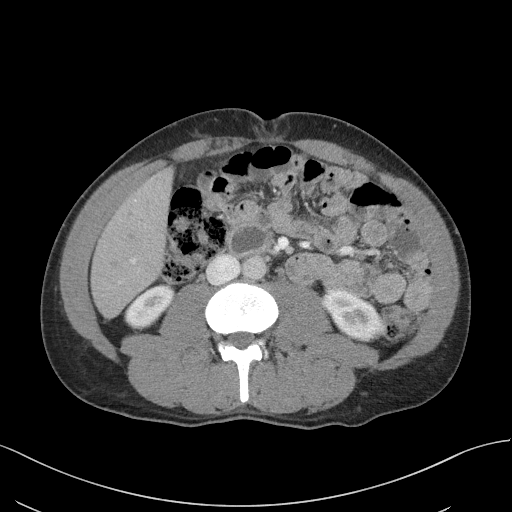
[im 64/93  soft-tissue]
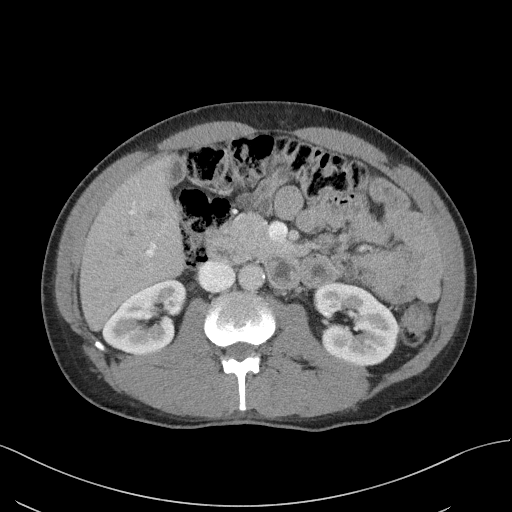
[im 64/93  bone]
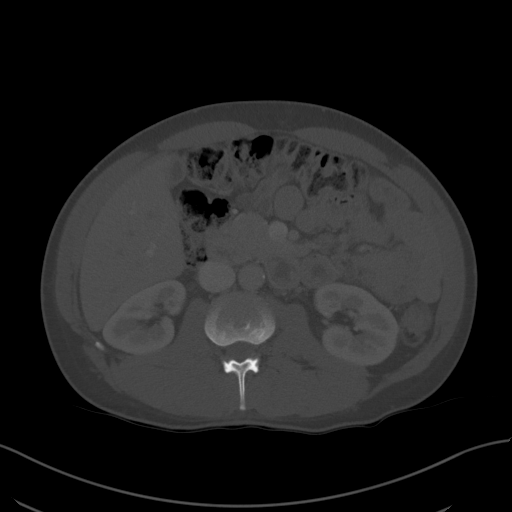
[im 71/93  soft-tissue]
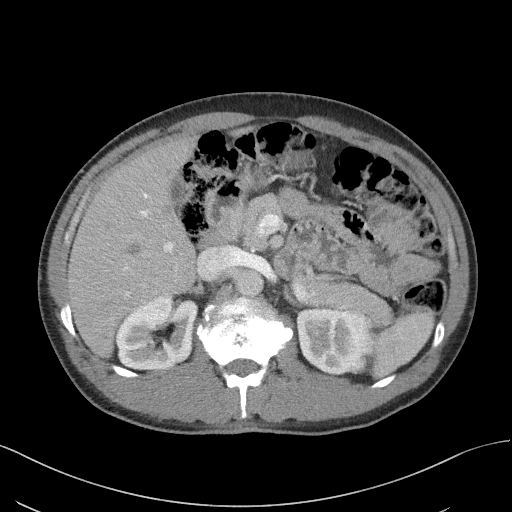
[im 78/93  soft-tissue]
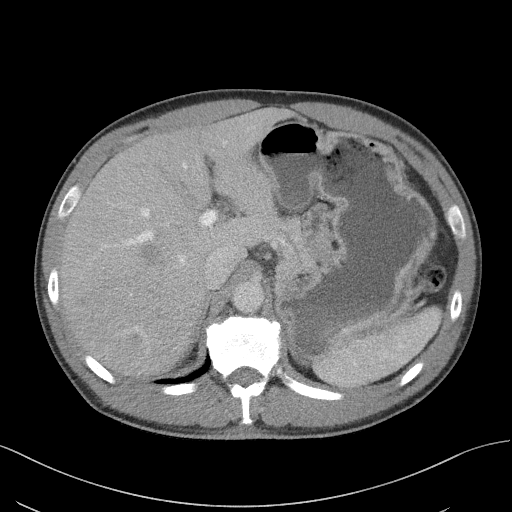
[im 85/93  soft-tissue]
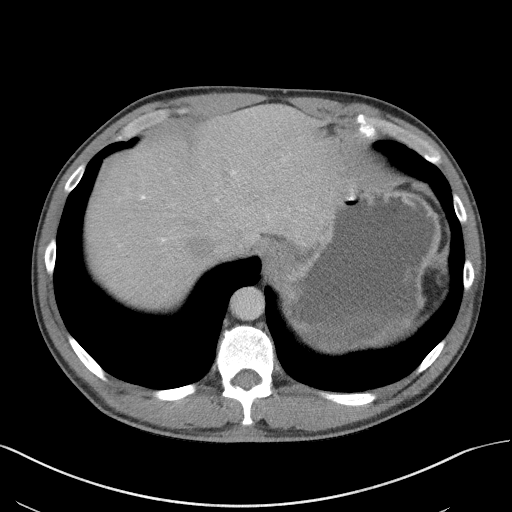

[Series 6: coronal st · coronal · 0.74mm/px · 3 of 97 slices shown]
[im 33/97  soft-tissue]
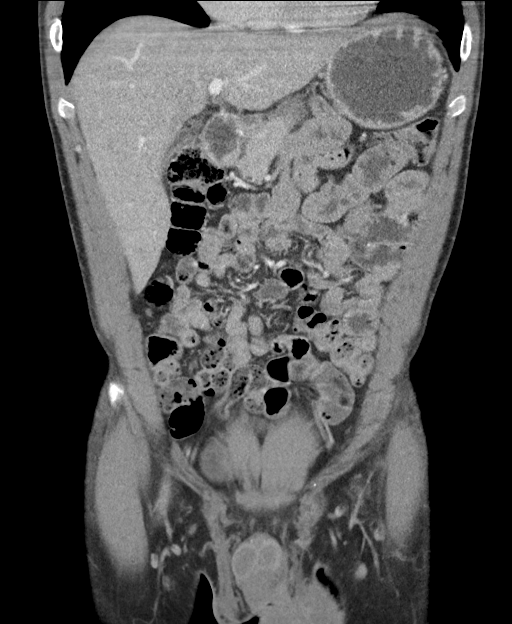
[im 43/97  soft-tissue]
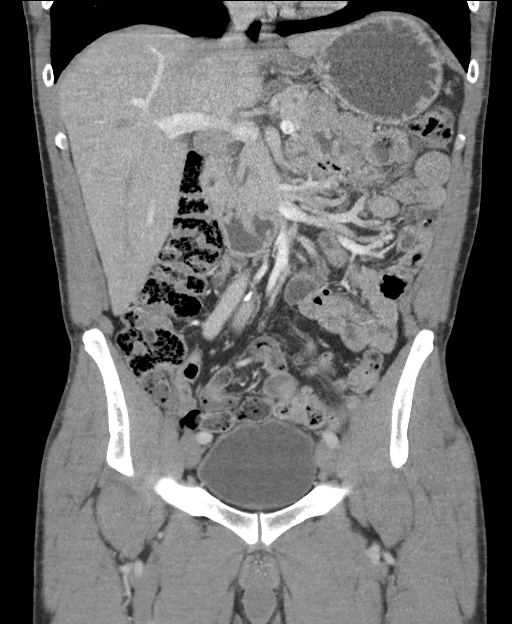
[im 54/97  soft-tissue]
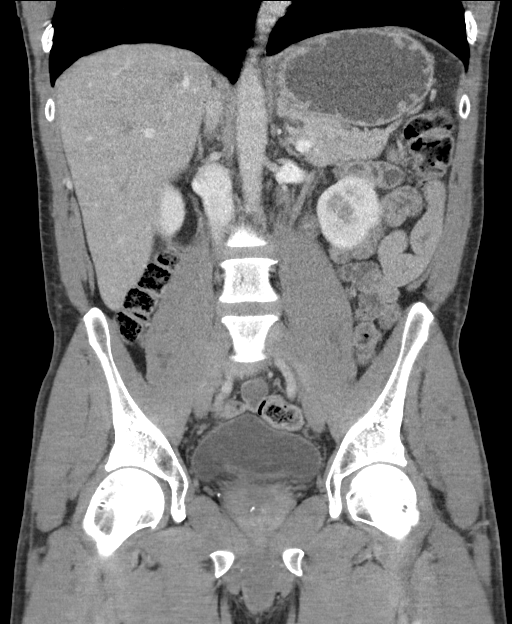

[15 of 46 positions shown; findings below may reference images not displayed]

FINDINGS: Lower chest: The visualized heart size within normal limits. No
pericardial fluid/thickening.

No hiatal hernia.

The visualized portions of the lungs are clear.

Hepatobiliary: There is a tiny 7 mm hypodense lesion seen in the
anterior left liver lobe. The main portal vein is patent. No
evidence of calcified gallstones, gallbladder wall thickening or
biliary dilatation.

Pancreas: Unremarkable. No pancreatic ductal dilatation or
surrounding inflammatory changes.

Spleen: Normal in size without focal abnormality.

Adrenals/Urinary Tract: Both adrenal glands appear normal. The
kidneys and collecting system appear normal without evidence of
urinary tract calculus or hydronephrosis. Bladder is unremarkable.

Stomach/Bowel: There is a moderately dilated stomach with fluid and
food contents seen. No gastric wall thickening or surrounding
inflammatory changes. However is noted. The small bowel is
unremarkable. There does however appear to be some fecalization of
distal ileal loops. A moderate amount of colonic stool is present.
Scattered colonic diverticula are noted. No inflammatory changes,
wall thickening, or obstructive findings.

Vascular/Lymphatic: There are no enlarged mesenteric,
retroperitoneal, or pelvic lymph nodes. Scattered aortic
atherosclerosis is noted.

Reproductive: The prostate is unremarkable.

Other: No evidence of abdominal wall mass or hernia.

Musculoskeletal: No acute or significant osseous findings.
IMPRESSION: 1. Moderately dilated stomach with fluid and food debris which could
be due to gastric outlet obstruction or delayed gastric emptying.
2. Fecalization of the distal ileal loops, which can be due to slow
transit.
3. Diverticulosis without diverticulitis.
4.  Aortic Atherosclerosis ([id number]).
